# Patient Record
Sex: Female | Born: 2000 | Race: Black or African American | Hispanic: No | Marital: Single | Smoking: Current every day smoker
Health system: Southern US, Community
[De-identification: ages and names within clinical notes are randomized; demographics above are authoritative.]

---
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
## 2019-04-09 ENCOUNTER — Emergency Department: Payer: Medicaid Other

## 2019-04-09 ENCOUNTER — Encounter: Payer: Self-pay | Admitting: Emergency Medicine

## 2019-04-09 ENCOUNTER — Emergency Department
Admission: EM | Admit: 2019-04-09 | Discharge: 2019-04-09 | Discharge status: Against Medical Advice | Payer: Medicaid Other | Attending: Emergency Medicine | Admitting: Emergency Medicine

## 2019-04-09 ENCOUNTER — Other Ambulatory Visit: Payer: Self-pay

## 2019-04-09 DIAGNOSIS — Z5329 Procedure and treatment not carried out because of patient's decision for other reasons: Secondary | ICD-10-CM | POA: Insufficient documentation

## 2019-04-09 DIAGNOSIS — R51 Headache: Secondary | ICD-10-CM | POA: Diagnosis present

## 2019-04-09 DIAGNOSIS — M542 Cervicalgia: Secondary | ICD-10-CM | POA: Diagnosis not present

## 2019-04-09 DIAGNOSIS — R519 Headache, unspecified: Secondary | ICD-10-CM

## 2019-04-09 DIAGNOSIS — F17200 Nicotine dependence, unspecified, uncomplicated: Secondary | ICD-10-CM | POA: Insufficient documentation

## 2019-04-09 LAB — CBC
HCT: 39.1 % (ref 36.0–46.0)
Hemoglobin: 12.8 g/dL (ref 12.0–15.0)
MCH: 27.6 pg (ref 26.0–34.0)
MCHC: 32.7 g/dL (ref 30.0–36.0)
MCV: 84.4 fL (ref 80.0–100.0)
Platelets: 318 10*3/uL (ref 150–400)
RBC: 4.63 MIL/uL (ref 3.87–5.11)
RDW: 13.4 % (ref 11.5–15.5)
WBC: 7.9 10*3/uL (ref 4.0–10.5)
nRBC: 0 % (ref 0.0–0.2)

## 2019-04-09 LAB — BASIC METABOLIC PANEL
Anion gap: 10 (ref 5–15)
BUN: 11 mg/dL (ref 6–20)
CO2: 23 mmol/L (ref 22–32)
Calcium: 8.8 mg/dL — ABNORMAL LOW (ref 8.9–10.3)
Chloride: 106 mmol/L (ref 98–111)
Creatinine, Ser: 0.59 mg/dL (ref 0.44–1.00)
GFR calc Af Amer: >60 mL/min (ref >60)
GFR calc non Af Amer: >60 mL/min (ref >60)
Glucose, Bld: 114 mg/dL — ABNORMAL HIGH (ref 70–99)
Potassium: 4 mmol/L (ref 3.5–5.1)
Sodium: 139 mmol/L (ref 135–145)

## 2019-04-09 LAB — POCT PREGNANCY, URINE: Preg Test, Ur: NEGATIVE

## 2019-04-09 MED ORDER — SODIUM CHLORIDE 0.9 % IV BOLUS
1000.0000 mL | Freq: Once | INTRAVENOUS | Status: AC
  Administered 2019-04-09: 1000 mL via INTRAVENOUS

## 2019-04-09 MED ORDER — ACETAMINOPHEN 325 MG PO TABS
650.0000 mg | ORAL_TABLET | Freq: Once | ORAL | Status: AC
  Administered 2019-04-09: 650 mg via ORAL
  Filled 2019-04-09: qty 2

## 2019-04-09 MED ORDER — METOCLOPRAMIDE HCL 5 MG/ML IJ SOLN
10.0000 mg | Freq: Once | INTRAMUSCULAR | Status: AC
  Administered 2019-04-09: 10 mg via INTRAVENOUS
  Filled 2019-04-09: qty 2

## 2019-04-09 MED ORDER — IOHEXOL 350 MG/ML SOLN
75.0000 mL | Freq: Once | INTRAVENOUS | Status: AC | PRN
  Administered 2019-04-09: 75 mL via INTRAVENOUS

## 2019-04-09 MED ORDER — BUTALBITAL-APAP-CAFFEINE 50-325-40 MG PO TABS
1.0000 | ORAL_TABLET | Freq: Once | ORAL | Status: AC
  Administered 2019-04-09: 1 via ORAL
  Filled 2019-04-09: qty 1

## 2019-04-09 NOTE — ED Notes (Signed)
Pt signed AMA form. IV removed from R AC. Pt seen leaving ED with steady gait. A&Ox4.

## 2019-04-09 NOTE — ED Provider Notes (Signed)
[facility] Emergency Department Provider Note  ____________________________________________  Time seen: Approximately 7:46 AM  I have reviewed the triage vital signs and the nursing notes.   HISTORY  Chief Complaint Headache   HPI Nicole Mcdonald is a 18 y.o. female who presents for evaluation of a headache.  Patient reports that the headache started mild yesterday after she picked up her new prescription glasses.  Throughout the day yesterday the headache was persistent but mild.  She took 2 Tylenol and went to bed.  This morning when she woke up her headache was severe.  She is also complaining of pain on the left side of her neck which is sharp.  She is complaining of a frontal severe sharp headache that is worse with any movement.  She denies any prior history of headaches or migraines.  She denies any changes in vision, nausea or vomiting, neck stiffness, fever.  She denies any trauma.  She denies drug use.  PMH Childhood or adolescent antisocial behavior    DMDD (disruptive mood dysregulation disorder) (CMS-HCC)      Allergies Patient has no known allergies.  History reviewed. No pertinent family history.  Social History Social History   Tobacco Use   Smoking status: Current Every Day Smoker   Smokeless tobacco: Never Used  Substance Use Topics   Alcohol use: Yes   Drug use: Not on file    Review of Systems  Constitutional: Negative for fever. Eyes: Negative for visual changes. ENT: Negative for sore throat. Neck: + neck pain  Cardiovascular: Negative for chest pain. Respiratory: Negative for shortness of breath. Gastrointestinal: Negative for abdominal pain, vomiting or diarrhea. Genitourinary: Negative for dysuria. Musculoskeletal: Negative for back pain. Skin: Negative for rash. Neurological: Negative for weakness or numbness. + HA Psych: No SI or HI  ____________________________________________   PHYSICAL  EXAM:  VITAL SIGNS: ED Triage Vitals  Enc Vitals Group     BP 04/09/19 0639 119/64     Pulse Rate 04/09/19 0639 (!) 107     Resp 04/09/19 0639 20     Temp 04/09/19 0639 98.3 F (36.8 C)     Temp Source 04/09/19 0639 Oral     SpO2 04/09/19 0639 97 %     Weight --      Height 04/09/19 0637 5\' 6"  (1.676 m)     Head Circumference --      Peak Flow --      Pain Score 04/09/19 0721 10     Pain Loc --      Pain Edu? --      Excl. in GC? --     Constitutional: Alert and oriented, looks uncomfortable, crying and holding her head.  HEENT:      Head: Normocephalic and atraumatic.         Eyes: Conjunctivae are normal. Sclera is non-icteric.  Pupils are equal round and reactive bilaterally with intact extraocular movements.      Mouth/Throat: Mucous membranes are moist.       Neck: Supple with no signs of meningismus.  Patient is tender to palpation on the left part of her neck Cardiovascular: Regular rate and rhythm. No murmurs, gallops, or rubs. 2+ symmetrical distal pulses are present in all extremities. No JVD. Respiratory: Normal respiratory effort. Lungs are clear to auscultation bilaterally. No wheezes, crackles, or rhonchi.  Gastrointestinal: Soft, non tender, and non distended with positive bowel sounds. No rebound or guarding. Musculoskeletal: Nontender with normal range of motion in all  extremities. No edema, cyanosis, or erythema of extremities. Neurologic: Normal speech and language. Face is symmetric. Moving all extremities.  Intact strength and sensation x4.  No gross focal neurologic deficits are appreciated. Skin: Skin is warm, dry and intact. No rash noted. Psychiatric: Mood and affect are normal. Speech and behavior are normal.  ____________________________________________   LABS (all labs ordered are listed, but only abnormal results are displayed)  Labs Reviewed  BASIC METABOLIC PANEL - Abnormal; Notable for the following components:      Result Value   Glucose,  Bld 114 (*)    Calcium 8.8 (*)    All other components within normal limits  CBC  POCT PREGNANCY, URINE   ____________________________________________  EKG  none  ____________________________________________  RADIOLOGY  I have personally reviewed the images performed during this visit and I agree with the Radiologist's read.   Interpretation by Radiologist:  Ct Angio Head W Or Wo Contrast  Result Date: 04/09/2019 CLINICAL DATA:  Frontal headache for 1 day EXAM: CT ANGIOGRAPHY HEAD AND NECK TECHNIQUE: Multidetector CT imaging of the head and neck was performed using the standard protocol during bolus administration of intravenous contrast. Multiplanar CT image reconstructions and MIPs were obtained to evaluate the vascular anatomy. Carotid stenosis measurements (when applicable) are obtained utilizing NASCET criteria, using the distal internal carotid diameter as the denominator. CONTRAST:  75mL OMNIPAQUE IOHEXOL 350 MG/ML SOLN COMPARISON:  None. FINDINGS: CT HEAD FINDINGS Brain: No evidence of acute infarction, hemorrhage, hydrocephalus, extra-axial collection or mass lesion/mass effect. Vascular: Negative Skull: Normal. Negative for fracture or focal lesion. Sinuses: Clear Orbits: Negative Review of the MIP images confirms the above findings CTA NECK FINDINGS Aortic arch: Normal Right carotid system: Vessels are smooth and widely patent Left carotid system: Vessels are smooth and widely patent. Vertebral arteries: Essentially codominant vertebral arteries. Mild vertebral tortuosity without beading or stenosis. Skeleton: Normal Other neck: Negative Upper chest: Negative Review of the MIP images confirms the above findings CTA HEAD FINDINGS Anterior circulation: Vessels are smooth and widely patent. Negative for aneurysm Posterior circulation: Vessels are smooth and widely patent. Negative for aneurysm Venous sinuses: Patent. Anatomic variants: Negative Delayed phase: Not obtained in this young  patient Review of the MIP images confirms the above findings IMPRESSION: Negative CTA of the head neck. Electronically Signed   By: Marnee SpringJonathon  Watts M.D.   On: 04/09/2019 10:48   Ct Angio Neck W And/or Wo Contrast  Result Date: 04/09/2019 CLINICAL DATA:  Frontal headache for 1 day EXAM: CT ANGIOGRAPHY HEAD AND NECK TECHNIQUE: Multidetector CT imaging of the head and neck was performed using the standard protocol during bolus administration of intravenous contrast. Multiplanar CT image reconstructions and MIPs were obtained to evaluate the vascular anatomy. Carotid stenosis measurements (when applicable) are obtained utilizing NASCET criteria, using the distal internal carotid diameter as the denominator. CONTRAST:  75mL OMNIPAQUE IOHEXOL 350 MG/ML SOLN COMPARISON:  None. FINDINGS: CT HEAD FINDINGS Brain: No evidence of acute infarction, hemorrhage, hydrocephalus, extra-axial collection or mass lesion/mass effect. Vascular: Negative Skull: Normal. Negative for fracture or focal lesion. Sinuses: Clear Orbits: Negative Review of the MIP images confirms the above findings CTA NECK FINDINGS Aortic arch: Normal Right carotid system: Vessels are smooth and widely patent Left carotid system: Vessels are smooth and widely patent. Vertebral arteries: Essentially codominant vertebral arteries. Mild vertebral tortuosity without beading or stenosis. Skeleton: Normal Other neck: Negative Upper chest: Negative Review of the MIP images confirms the above findings CTA HEAD FINDINGS Anterior circulation: Vessels  are smooth and widely patent. Negative for aneurysm Posterior circulation: Vessels are smooth and widely patent. Negative for aneurysm Venous sinuses: Patent. Anatomic variants: Negative Delayed phase: Not obtained in this young patient Review of the MIP images confirms the above findings IMPRESSION: Negative CTA of the head neck. Electronically Signed   By: Marnee Spring M.D.   On: 04/09/2019 10:48       ____________________________________________   PROCEDURES  Procedure(s) performed: None Procedures Critical Care performed:  None ____________________________________________   INITIAL IMPRESSION / ASSESSMENT AND PLAN / ED COURSE  18 y.o. female who presents for evaluation of a severe headache and L sided neck pain.  Ddx cervical artery dissection, migraine headache, tension headache, torticollis.  Patient had a headache for almost 24 hours before it became severe therefore less likely subarachnoid hemorrhage.  Patient looks uncomfortable, holding her head and crying, palpation of the left side of her neck induces pain, no meningeal signs, neurologically intact otherwise.  Normal vitals with no fever.  We will send patient for CT Angio of the head and neck, will get basic labs.  We will treat her pain with a migraine cocktail.  Clinical Course as of Apr 08 1058  Fri Apr 09, 2019  1018 HA fully resolved. Patient decided to leave AMA prior to results of her CTs since she needs to pick up a family member in [place]. Results of CT still pending. Will contact patient if the CT shows anything acute. Discussed risks of leaving before the results.  Patient aware and still left AGAINST MEDICAL ADVICE.   [CV]  1059 CT of head and neck read as normal per radiologist.  I reviewed the images and do not see any abnormalities.   [CV]    Clinical Course User Index [CV] Don Perking [place], MD     As part of my medical decision making, I reviewed the following data within the electronic MEDICAL RECORD NUMBER Nursing notes reviewed and incorporated, Labs reviewed , Old chart reviewed, Radiograph reviewed , Notes from prior ED visits and Mobile Controlled Substance Database    Pertinent labs & imaging results that were available during my care of the patient were reviewed by me and considered in my medical decision making (see chart for details).    ____________________________________________   FINAL  CLINICAL IMPRESSION(S) / ED DIAGNOSES  Final diagnoses:  Acute nonintractable headache, unspecified headache type      NEW MEDICATIONS STARTED DURING THIS VISIT:  ED Discharge Orders    None       Note:  This document was prepared using Dragon voice recognition software and may include unintentional dictation errors.    Don Perking, [place], MD 04/09/19 1100

## 2019-04-09 NOTE — ED Notes (Signed)
Pt again stating she wants to leave and cannot wait for CT results. Pt states she needs to be in [place] at 1030 to pick up foster care check to be able to pay rent this month. States pain reduced to 4/10. Explained that even with improvement in pain there is possibility that a life-threatening condition still exists and CT results are needed to rule that out. Also explained that pt's life is more important than anything else. Pt still wishes to leave and will sign AMA form. EDP Veronese notified.

## 2019-04-09 NOTE — ED Notes (Signed)
Pt refusing IVF at this time. Pt appears distressed, states she wants to go home and be with her boyfriend. EDP called to bedside, explaining risks of leaving prior to CT being completed. Pt given phone to call boyfriend, temp reduced per pt request, pt encouraged to stay and allow PO meds some time to help with pain. Pt informed that CBC has resulted already and CT is just awaiting results of BMP to do CT scan. Emotional support provided.

## 2019-04-09 NOTE — ED Triage Notes (Signed)
Pt c/o frontal headache x1 day. Pt denies taking OTC medication for relief. Pt denies blurred vision, N/V.

## 2020-05-03 IMAGING — CT CT ANGIOGRAPHY HEAD
1 of 10 series · 6 of 33 positions shown · IV contrast (APPLIED)
Comparison: None.

CLINICAL DATA: Frontal headache for 1 day

EXAM:
CT ANGIOGRAPHY HEAD AND NECK
TECHNIQUE: Multidetector CT imaging of the head and neck was performed using
the standard protocol during bolus administration of intravenous
contrast. Multiplanar CT image reconstructions and MIPs were
obtained to evaluate the vascular anatomy. Carotid stenosis
measurements (when applicable) are obtained utilizing NASCET
criteria, using the distal internal carotid diameter as the
denominator.
CONTRAST:  75mL OMNIPAQUE IOHEXOL 350 MG/ML SOLN

[Series 7: ax thin · axial · 0.47mm/px · z∈[-310,-89]mm · 6 of 311 slices shown]
[im 45/311  soft-tissue]
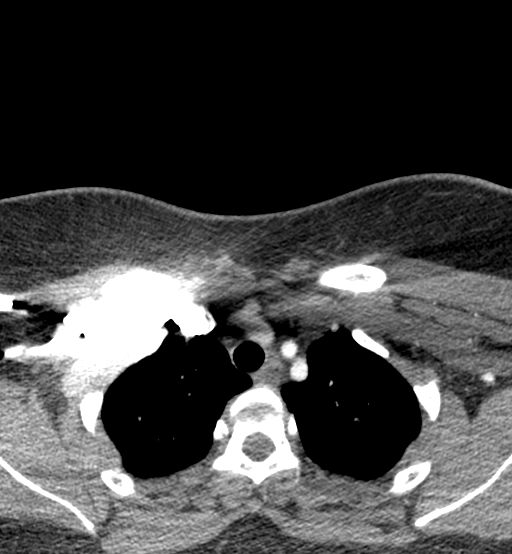
[im 89/311  bone]
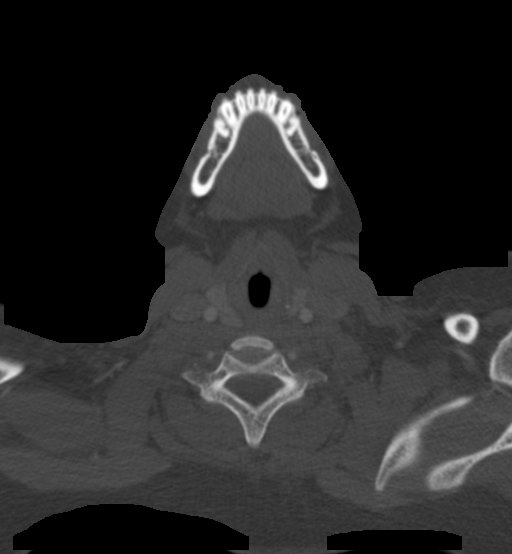
[im 133/311  soft-tissue]
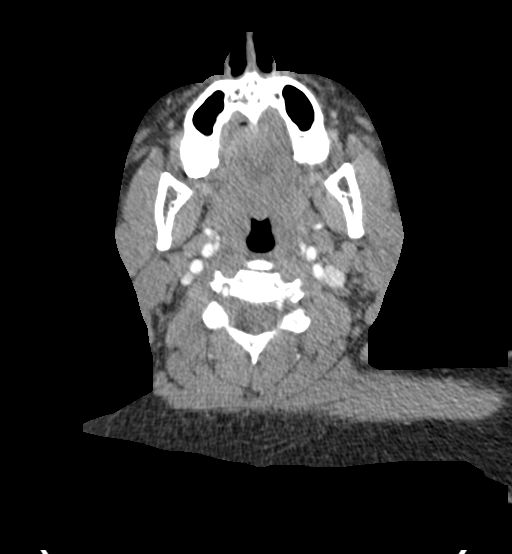
[im 178/311  bone]
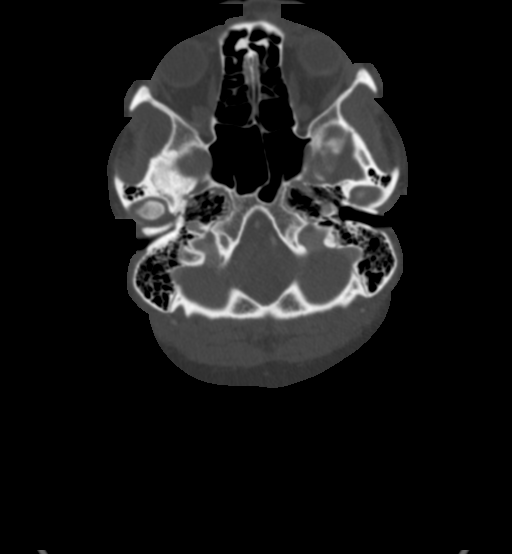
[im 222/311  soft-tissue]
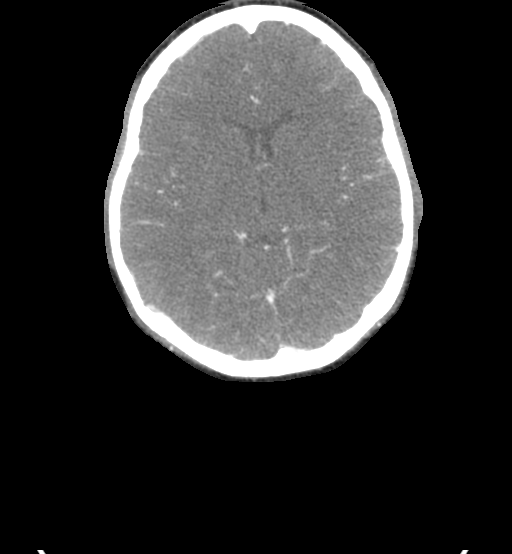
[im 266/311  bone]
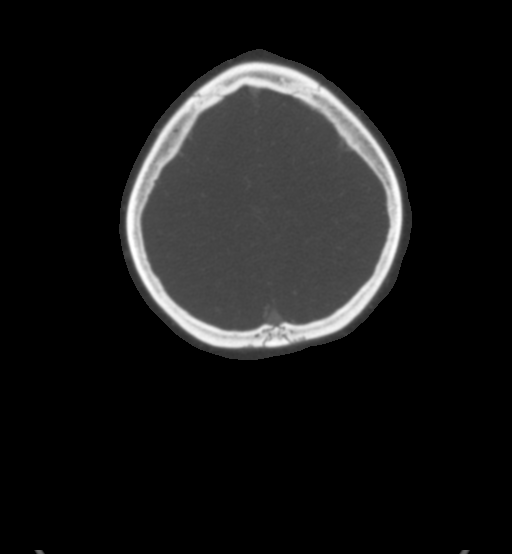

[6 of 33 positions shown; findings below may reference images not displayed]

FINDINGS: CT HEAD FINDINGS

Brain: No evidence of acute infarction, hemorrhage, hydrocephalus,
extra-axial collection or mass lesion/mass effect.

Vascular: Negative

Skull: Normal. Negative for fracture or focal lesion.

Sinuses: Clear

Orbits: Negative

Review of the MIP images confirms the above findings

CTA NECK FINDINGS

Aortic arch: Normal

Right carotid system: Vessels are smooth and widely patent

Left carotid system: Vessels are smooth and widely patent.

Vertebral arteries: Essentially codominant vertebral arteries. Mild
vertebral tortuosity without beading or stenosis.

Skeleton: Normal

Other neck: Negative

Upper chest: Negative

Review of the MIP images confirms the above findings

CTA HEAD FINDINGS

Anterior circulation: Vessels are smooth and widely patent. Negative
for aneurysm

Posterior circulation: Vessels are smooth and widely patent.
Negative for aneurysm

Venous sinuses: Patent.

Anatomic variants: Negative

Delayed phase: Not obtained in this young patient

Review of the MIP images confirms the above findings
IMPRESSION: Negative CTA of the head neck.
